# Patient Record
Sex: Male | Born: 1990 | Race: White | Hispanic: No | Marital: Single | Smoking: Current every day smoker
Health system: Southern US, Community
[De-identification: ages and names within clinical notes are randomized; demographics above are authoritative.]

---

## 2017-07-16 ENCOUNTER — Encounter (HOSPITAL_COMMUNITY): Payer: Self-pay | Admitting: Emergency Medicine

## 2017-07-16 ENCOUNTER — Other Ambulatory Visit: Payer: Self-pay

## 2017-07-16 ENCOUNTER — Emergency Department (HOSPITAL_COMMUNITY)
Admission: EM | Admit: 2017-07-16 | Discharge: 2017-07-16 | Disposition: A | Discharge status: Home / Self Care | Payer: Self-pay | Attending: Emergency Medicine | Admitting: Emergency Medicine

## 2017-07-16 DIAGNOSIS — Y905 Blood alcohol level of 100-119 mg/100 ml: Secondary | ICD-10-CM | POA: Insufficient documentation

## 2017-07-16 DIAGNOSIS — F17210 Nicotine dependence, cigarettes, uncomplicated: Secondary | ICD-10-CM | POA: Insufficient documentation

## 2017-07-16 DIAGNOSIS — T401X1A Poisoning by heroin, accidental (unintentional), initial encounter: Secondary | ICD-10-CM | POA: Insufficient documentation

## 2017-07-16 DIAGNOSIS — F10920 Alcohol use, unspecified with intoxication, uncomplicated: Secondary | ICD-10-CM | POA: Insufficient documentation

## 2017-07-16 LAB — CBC
HEMATOCRIT: 37.8 % — AB (ref 39.0–52.0)
Hemoglobin: 12.8 g/dL — ABNORMAL LOW (ref 13.0–17.0)
MCH: 31.4 pg (ref 26.0–34.0)
MCHC: 33.9 g/dL (ref 30.0–36.0)
MCV: 92.6 fL (ref 78.0–100.0)
Platelets: 186 10*3/uL (ref 150–400)
RBC: 4.08 MIL/uL — ABNORMAL LOW (ref 4.22–5.81)
RDW: 13.2 % (ref 11.5–15.5)
WBC: 9.3 10*3/uL (ref 4.0–10.5)

## 2017-07-16 LAB — COMPREHENSIVE METABOLIC PANEL
ALBUMIN: 3.8 g/dL (ref 3.5–5.0)
ALK PHOS: 56 U/L (ref 38–126)
ALT: 26 U/L (ref 17–63)
AST: 41 U/L (ref 15–41)
Anion gap: 11 (ref 5–15)
BILIRUBIN TOTAL: 0.6 mg/dL (ref 0.3–1.2)
BUN: 14 mg/dL (ref 6–20)
CO2: 24 mmol/L (ref 22–32)
CREATININE: 0.91 mg/dL (ref 0.61–1.24)
Calcium: 8.5 mg/dL — ABNORMAL LOW (ref 8.9–10.3)
Chloride: 100 mmol/L — ABNORMAL LOW (ref 101–111)
GFR calc Af Amer: >60 mL/min (ref >60)
GLUCOSE: 93 mg/dL (ref 65–99)
POTASSIUM: 3.7 mmol/L (ref 3.5–5.1)
Sodium: 135 mmol/L (ref 135–145)
TOTAL PROTEIN: 6.5 g/dL (ref 6.5–8.1)

## 2017-07-16 LAB — RAPID URINE DRUG SCREEN, HOSP PERFORMED
AMPHETAMINES: NOT DETECTED
BARBITURATES: NOT DETECTED
BENZODIAZEPINES: NOT DETECTED
Cocaine: POSITIVE — AB
Opiates: POSITIVE — AB
Tetrahydrocannabinol: NOT DETECTED

## 2017-07-16 LAB — ETHANOL: ALCOHOL ETHYL (B): 119 mg/dL — AB (ref <10)

## 2017-07-16 LAB — CBG MONITORING, ED: GLUCOSE-CAPILLARY: 88 mg/dL (ref 65–99)

## 2017-07-16 LAB — ACETAMINOPHEN LEVEL: Acetaminophen (Tylenol), Serum: <10 ug/mL — ABNORMAL LOW (ref 10–30)

## 2017-07-16 LAB — SALICYLATE LEVEL: Salicylate Lvl: <7 mg/dL (ref 2.8–30.0)

## 2017-07-16 NOTE — ED Notes (Signed)
Deb (girlfriend) phone number: [phone]

## 2017-07-16 NOTE — ED Provider Notes (Signed)
[facility]-EMERGENCY DEPT Provider Note   CSN: [id number] Arrival date & time: 07/16/17  0101     History   Chief Complaint Chief Complaint  Patient presents with  . Drug Overdose    HPI Stuart Riddle. is a 27 y.o. male.  Chief complaint is alcohol intoxication, heroin use.  HPI 27 year old male via EMS.  Had to undergo bag-valve-mask rescue after girlfriend found him unconscious in the next room.  Triage note states no Narcan was given.  However he was given intranasal Narcan by paramedics.  He arrives awake and alert.  States he did "just a little bit" of heroin.  He "shot"it.   States he does it infrequently.  History reviewed. No pertinent past medical history.  There are no active problems to display for this patient.   History reviewed. No pertinent surgical history.      Home Medications    Prior to Admission medications   Not on File    Family History History reviewed. No pertinent family history.  Social History Social History   Tobacco Use  . Smoking status: Current Every Day Smoker    Types: Cigarettes  . Smokeless tobacco: Never Used  . Tobacco comment: 10 a day  Substance Use Topics  . Alcohol use: Yes    Alcohol/week: 3.0 oz    Types: 5 Cans of beer per week    Comment: "just a few drinks on the weekends"  . Drug use: Not on file    Comment: denies     Allergies   Patient has no known allergies.   Review of Systems Review of Systems  Constitutional: Negative for appetite change, chills, diaphoresis, fatigue and fever.  HENT: Negative for mouth sores, sore throat and trouble swallowing.   Eyes: Negative for visual disturbance.  Respiratory: Negative for cough, chest tightness, shortness of breath and wheezing.   Cardiovascular: Negative for chest pain.  Gastrointestinal: Negative for abdominal distention, abdominal pain, diarrhea, nausea and vomiting.  Endocrine: Negative for polydipsia, polyphagia and polyuria.   Genitourinary: Negative for dysuria, frequency and hematuria.  Musculoskeletal: Negative for gait problem.  Skin: Negative for color change, pallor and rash.  Neurological: Negative for dizziness, syncope, light-headedness and headaches.  Hematological: Does not bruise/bleed easily.  Psychiatric/Behavioral: Negative for behavioral problems and confusion.  No current complaints.  States he feels "just tired".   Physical Exam Updated Vital Signs BP (!) 103/51   Pulse 77   Temp 97.7 F (36.5 C) (Oral)   Resp 15   Ht  (1.727 m)   Wt 72.6 kg (160 lb)   SpO2 99%   BMI 24.33 kg/m   Physical Exam  Constitutional: He is oriented to person, place, and time. He appears well-developed and well-nourished. No distress.  Awake.  Eyes closed but awakens to voice.  Lucid.  Pupils 4 mm symmetric reactive.  HENT:  Head: Normocephalic.  Eyes: Pupils are equal, round, and reactive to light. Conjunctivae are normal. No scleral icterus.  Neck: Normal range of motion. Neck supple. No thyromegaly present.  Cardiovascular: Normal rate and regular rhythm. Exam reveals no gallop and no friction rub.  No murmur heard. Pulmonary/Chest: Effort normal and breath sounds normal. No respiratory distress. He has no wheezes. He has no rales.  Abdominal: Soft. Bowel sounds are normal. He exhibits no distension. There is no tenderness. There is no rebound.  Musculoskeletal: Normal range of motion.  Neurological: He is alert and oriented to person, place, and time.  Skin: Skin is warm and dry. No rash noted.  Psychiatric: He has a normal mood and affect. His behavior is normal.     ED Treatments / Results  Labs (all labs ordered are listed, but only abnormal results are displayed) Labs Reviewed  COMPREHENSIVE METABOLIC PANEL - Abnormal; Notable for the following components:      Result Value   Chloride 100 (*)    Calcium 8.5 (*)    All other components within normal limits  ETHANOL - Abnormal;  Notable for the following components:   Alcohol, Ethyl (B) 119 (*)    All other components within normal limits  ACETAMINOPHEN LEVEL - Abnormal; Notable for the following components:   Acetaminophen (Tylenol), Serum <10 (*)    All other components within normal limits  CBC - Abnormal; Notable for the following components:   RBC 4.08 (*)    Hemoglobin 12.8 (*)    HCT 37.8 (*)    All other components within normal limits  RAPID URINE DRUG SCREEN, HOSP PERFORMED - Abnormal; Notable for the following components:   Opiates POSITIVE (*)    Cocaine POSITIVE (*)    All other components within normal limits  SALICYLATE LEVEL  CBG MONITORING, ED    EKG None  Radiology No results found.  Procedures Procedures (including critical care time)  Medications Ordered in ED Medications - No data to display   Initial Impression / Assessment and Plan / ED Course  I have reviewed the triage vital signs and the nursing notes.  Pertinent labs & imaging results that were available during my care of the patient were reviewed by me and considered in my medical decision making (see chart for details).    Here for several hours.  Remains awake and alert lucid requires no additional rescue.  Alcohol 119.  UDS positive opiate, cocaine.  At this point he has had several hours to metabolize medications.  Is ambulatory.  Tolerating p.o.  Appropriate for discharge.  Final Clinical Impressions(s) / ED Diagnoses   Final diagnoses:  Accidental overdose of heroin, initial encounter [facility])  Alcoholic intoxication without complication [facility])    ED Discharge Orders    None       Rolland Porter, MD 07/16/17 [phone]

## 2017-07-16 NOTE — ED Notes (Signed)
Pt reminded once again a urine specimen is needed.

## 2017-07-16 NOTE — ED Notes (Signed)
Pt reminded a urine specimen was needed and has urinal at bedside.

## 2017-07-16 NOTE — ED Notes (Signed)
Bed: RESB Expected date:  Expected time:  Means of arrival:  Comments: Opiate overdose

## 2017-07-16 NOTE — ED Triage Notes (Signed)
Patient BIB [facility] from home, GF called EMS after finding pt passed out in bathroom, purple color an gasping. EMS reports agonal breathing on arrival. Pt was bagged for several minutes and then became a/o x4. No narcan given. Pt denies any drug use but partial needle found at scene. Pt endorses drinking 5-6 beers.

## 2017-07-16 NOTE — Discharge Instructions (Addendum)
You can die from Heroin use, even "just a little bit".

## 2017-08-22 ENCOUNTER — Encounter (HOSPITAL_COMMUNITY): Payer: Self-pay

## 2017-08-22 ENCOUNTER — Emergency Department (HOSPITAL_COMMUNITY)
Admission: EM | Admit: 2017-08-22 | Discharge: 2017-08-22 | Disposition: A | Discharge status: Home / Self Care | Payer: Self-pay | Attending: Emergency Medicine | Admitting: Emergency Medicine

## 2017-08-22 ENCOUNTER — Other Ambulatory Visit: Payer: Self-pay

## 2017-08-22 DIAGNOSIS — J029 Acute pharyngitis, unspecified: Secondary | ICD-10-CM | POA: Insufficient documentation

## 2017-08-22 DIAGNOSIS — F17210 Nicotine dependence, cigarettes, uncomplicated: Secondary | ICD-10-CM | POA: Insufficient documentation

## 2017-08-22 DIAGNOSIS — B9789 Other viral agents as the cause of diseases classified elsewhere: Secondary | ICD-10-CM | POA: Insufficient documentation

## 2017-08-22 DIAGNOSIS — J069 Acute upper respiratory infection, unspecified: Secondary | ICD-10-CM

## 2017-08-22 LAB — GROUP A STREP BY PCR: Group A Strep by PCR: NOT DETECTED

## 2017-08-22 NOTE — ED Provider Notes (Signed)
  [facility] Provider Note   CSN: [id number] Arrival date & time: 08/22/17  1226     History   Chief Complaint Chief Complaint  Patient presents with  . Sore Throat    HPI Stuart Riddle Jr. is a 27 y.o. male.  Pt comes in with c/o congestion, cough and sore throat that has been going on for a week. Denies fever. He states that he noticed some white spots on his throat.he state that he feels like he needs antibiotics because colds don't last this long     History reviewed. No pertinent past medical history.  There are no active problems to display for this patient.   History reviewed. No pertinent surgical history.      Home Medications    Prior to Admission medications   Not on File    Family History No family history on file.  Social History Social History   Tobacco Use  . Smoking status: Current Every Day Smoker    Types: Cigarettes  . Smokeless tobacco: Never Used  . Tobacco comment: 10 a day  Substance Use Topics  . Alcohol use: Yes    Alcohol/week: 3.0 oz    Types: 5 Cans of beer per week    Comment: "just a few drinks on the weekends"  . Drug use: Never    Comment: denies     Allergies   Patient has no known allergies.   Review of Systems Review of Systems  All other systems reviewed and are negative.    Physical Exam Updated Vital Signs Ht 5\' 8"  (1.727 m)   Wt 72.6 kg (160 lb)   BMI 24.33 kg/m   Physical Exam  Constitutional: He appears well-developed and well-nourished.  HENT:  Right Ear: Tympanic membrane normal.  Left Ear: Tympanic membrane normal.  Mouth/Throat: Mucous membranes are normal. Posterior oropharyngeal edema and posterior oropharyngeal erythema present.  Neck: Normal range of motion.  Cardiovascular: Normal rate.  Pulmonary/Chest: Effort normal and breath sounds normal.  Neurological: He is alert.  Skin: Skin is warm and dry.  Nursing note and vitals  reviewed.    ED Treatments / Results  Labs (all labs ordered are listed, but only abnormal results are displayed) Labs Reviewed  GROUP A STREP BY PCR    EKG None  Radiology No results found.  Procedures Procedures (including critical care time)  Medications Ordered in ED Medications - No data to display   Initial Impression / Assessment and Plan / ED Course  I have reviewed the triage vital signs and the nursing notes.  Pertinent labs & imaging results that were available during my care of the patient were reviewed by me and considered in my medical decision making (see chart for details).     Likely viral don't think any reason to treat with antibiotics at this time  Final Clinical Impressions(s) / ED Diagnoses   Final diagnoses:  None    ED Discharge Orders    None       Teressa Lowerickering, Mihail Prettyman, NP 08/22/17 1351    Donnetta Hutchingook, Brian, MD 08/24/17 1034

## 2017-08-22 NOTE — ED Notes (Signed)
Pt verbalized understanding discharge instructions and denies any further needs or questions at this time. VS stable, ambulatory and steady gait.   

## 2017-08-22 NOTE — ED Triage Notes (Signed)
Pt states that he has been sick for a week and looked in the mirror and noticed spots in his throat.

## 2018-02-02 ENCOUNTER — Encounter (HOSPITAL_COMMUNITY): Payer: Self-pay | Admitting: Emergency Medicine

## 2018-02-02 ENCOUNTER — Other Ambulatory Visit: Payer: Self-pay

## 2018-02-02 ENCOUNTER — Emergency Department (HOSPITAL_COMMUNITY): Payer: Self-pay

## 2018-02-02 ENCOUNTER — Emergency Department (HOSPITAL_COMMUNITY)
Admission: EM | Admit: 2018-02-02 | Discharge: 2018-02-02 | Disposition: A | Discharge status: Home / Self Care | Payer: Self-pay | Attending: Emergency Medicine | Admitting: Emergency Medicine

## 2018-02-02 DIAGNOSIS — F17210 Nicotine dependence, cigarettes, uncomplicated: Secondary | ICD-10-CM | POA: Insufficient documentation

## 2018-02-02 DIAGNOSIS — R7401 Elevation of levels of liver transaminase levels: Secondary | ICD-10-CM

## 2018-02-02 DIAGNOSIS — R740 Nonspecific elevation of levels of transaminase and lactic acid dehydrogenase [LDH]: Secondary | ICD-10-CM | POA: Insufficient documentation

## 2018-02-02 DIAGNOSIS — R1011 Right upper quadrant pain: Secondary | ICD-10-CM | POA: Insufficient documentation

## 2018-02-02 LAB — URINALYSIS, ROUTINE W REFLEX MICROSCOPIC
GLUCOSE, UA: NEGATIVE mg/dL
HGB URINE DIPSTICK: NEGATIVE
KETONES UR: NEGATIVE mg/dL
Leukocytes, UA: NEGATIVE
Nitrite: NEGATIVE
PH: 5 (ref 5.0–8.0)
PROTEIN: NEGATIVE mg/dL
Specific Gravity, Urine: 1.019 (ref 1.005–1.030)

## 2018-02-02 LAB — COMPREHENSIVE METABOLIC PANEL
ALK PHOS: 193 U/L — AB (ref 38–126)
ALT: 3451 U/L — AB (ref 0–44)
AST: 2270 U/L — ABNORMAL HIGH (ref 15–41)
Albumin: 3.5 g/dL (ref 3.5–5.0)
Anion gap: 6 (ref 5–15)
BUN: 9 mg/dL (ref 6–20)
CALCIUM: 9 mg/dL (ref 8.9–10.3)
CHLORIDE: 101 mmol/L (ref 98–111)
CO2: 29 mmol/L (ref 22–32)
CREATININE: 1.01 mg/dL (ref 0.61–1.24)
Glucose, Bld: 99 mg/dL (ref 70–99)
Potassium: 4.4 mmol/L (ref 3.5–5.1)
Sodium: 136 mmol/L (ref 135–145)
Total Bilirubin: 4.9 mg/dL — ABNORMAL HIGH (ref 0.3–1.2)
Total Protein: 7 g/dL (ref 6.5–8.1)

## 2018-02-02 LAB — CBC
HEMATOCRIT: 45 % (ref 39.0–52.0)
Hemoglobin: 14.5 g/dL (ref 13.0–17.0)
MCH: 28.9 pg (ref 26.0–34.0)
MCHC: 32.2 g/dL (ref 30.0–36.0)
MCV: 89.8 fL (ref 80.0–100.0)
NRBC: 0 % (ref 0.0–0.2)
PLATELETS: 175 10*3/uL (ref 150–400)
RBC: 5.01 MIL/uL (ref 4.22–5.81)
RDW: 15.4 % (ref 11.5–15.5)
WBC: 4.8 10*3/uL (ref 4.0–10.5)

## 2018-02-02 LAB — LIPASE, BLOOD: LIPASE: 34 U/L (ref 11–51)

## 2018-02-02 MED ORDER — LACTATED RINGERS IV BOLUS
2000.0000 mL | Freq: Once | INTRAVENOUS | Status: AC
  Administered 2018-02-02: 2000 mL via INTRAVENOUS

## 2018-02-02 MED ORDER — ONDANSETRON HCL 4 MG/2ML IJ SOLN
4.0000 mg | Freq: Once | INTRAMUSCULAR | Status: AC
  Administered 2018-02-02: 4 mg via INTRAVENOUS
  Filled 2018-02-02: qty 2

## 2018-02-02 NOTE — Care Management (Signed)
[facility]. Patient verbalized understanding teach back done. No further ED CM needs identified.

## 2018-02-02 NOTE — Discharge Instructions (Signed)
Your lab work showed running.  The results do not come back for a day or so.  If they are abnormal, you will be contacted.  Please follow-up with referred clinic to have your labs redrawn in about 1 week.  Additionally, please follow-up with referred GI doctor for further evaluation and normalities.  Do not drink any alcohol or take any Tylenol.  Additionally, do not use any marijuana or any other illicit drugs.  Return emergency department for any worsening pain, fever, nausea/vomiting, chest pain, difficulty breathing or any other worsening or concerning symptoms.

## 2018-02-02 NOTE — ED Notes (Signed)
Pt provided with urinal

## 2018-02-02 NOTE — ED Notes (Signed)
Patient transported to Ultrasound 

## 2018-02-02 NOTE — ED Provider Notes (Signed)
  Care assumed from Stuart Dessert, MD at shift change with RUQ u/s pending.   In brief, this patient is a 27 y.o. male who presents for evaluation of vomiting that began 2 days ago.  Patient reports that yesterday, had 2 episodes of nonbloody, numbness vomiting.  Also reports today, and the episode as well as urine being brown which bumped him to come to the department.  Reports that he drank a shot of liquor last weekend but no other alcohol since then.  No history of hepatitis.  On exam, he has some right upper quadrant pain.  Please see note from previous provider for full history/physical.  PLAN: Patient is pending an right upper quadrant ultrasound.  We will plan to talk with GI regarding his elevation in LFTs.  MDM:  CMP shows AST of 2, 270 and ALT of 3, 451.  Alk phos is elevated at 193 and total bili is elevated at 4.9.  Ultrasound shows massive gallbladder wall thickening without gallstones.  Common bile duct is not elevated. Consult to GI placed.   Discussed patient with Dr. Claudina Lick (GI).  He recommends observing patient in the ED until hepatitis panels are back so we can determine which strain of hepatitis this is.  He does state that if this was hepatitis A, there would not be much to do but follow-up with GI.  Additionally, he states that he would need to be referred to GI for hepatitis B/C.  He does state that patient needs a follow-up in about a week with either primary care doctor to get his LFTs repeated.  He does recommend that patient abstain from any alcohol or Tylenol use.  No further recommendations. No indication for admission at this time.   I discussed with patient.  I did instruct him that the hepatitis panel takes a day or so to come back.  Patient with no pain at this time.  He does not wish to be admitted for observation but wishes rather be discharged home.  Abdominal exam is benign.  Patient is hemodynamically stable.  I discussed with patient regarding follow-up with  GI.  Additionally, social work was able to establish an appointment with primary care for repeat labs in approximately 1 week.  I instructed patient to follow-up with referred resources.  Instructed patient to refrain from any alcohol or Tylenol use. Patient had ample opportunity for questions and discussion. All patient's questions were answered with full understanding. Strict return precautions discussed. Patient expresses understanding and agreement to plan.    1. Transaminitis   2. RUQ abdominal pain       Stuart Riddle 02/02/18 4580    Stuart Dessert, MD 02/03/18 1108

## 2018-02-02 NOTE — ED Provider Notes (Addendum)
[facility] EMERGENCY DEPARTMENT Provider Note   CSN: [id number] Arrival date & time: 02/02/18  1421     History   Chief Complaint Chief Complaint  Patient presents with  . Emesis  . Diarrhea    HPI Toluwani Yadav. is a 27 y.o. male.  The history is provided by the patient.  Emesis   This is a new problem. The current episode started 2 days ago. The problem occurs 2 to 4 times per day. The problem has not changed since onset.Associated symptoms include diarrhea. Pertinent negatives include no abdominal pain, no cough, no fever and no sweats. Associated symptoms comments: No chest pain, SOB but yesterday urine was really dark.  Drinks alcohol rarely.  Does smoke cigarettes but denies any drug use.  No prior history of hepatitis.  No prior abdominal surgeries.  Symptoms started with just feeling lightheaded and nauseated for several days but then started vomiting yesterday and diarrhea today.. Risk factors: girlfriend with similar sx.  no travel, bad food or antbiotics.  Diarrhea   Associated symptoms include vomiting. Pertinent negatives include no abdominal pain, no sweats and no cough.    History reviewed. No pertinent past medical history.  There are no active problems to display for this patient.   History reviewed. No pertinent surgical history.      Home Medications    Prior to Admission medications   Not on File    Family History History reviewed. No pertinent family history.  Social History Social History   Tobacco Use  . Smoking status: Current Every Day Smoker    Types: Cigarettes  . Smokeless tobacco: Never Used  . Tobacco comment: 10 a day  Substance Use Topics  . Alcohol use: Yes    Alcohol/week: 5.0 standard drinks    Types: 5 Cans of beer per week    Comment: "just a few drinks on the weekends"  . Drug use: Never    Comment: denies     Allergies   Patient has no known allergies.   Review of Systems Review of  Systems  Constitutional: Negative for fever.  Respiratory: Negative for cough.   Gastrointestinal: Positive for diarrhea and vomiting. Negative for abdominal pain.  All other systems reviewed and are negative.    Physical Exam Updated Vital Signs BP 105/73 (BP Location: Right Arm)   Pulse 76   Temp 97.6 F (36.4 C) (Oral)   Resp 16   Ht 5\' 8"  (1.727 m)   Wt 68 kg   SpO2 97%   BMI 22.81 kg/m   Physical Exam  Constitutional: He is oriented to person, place, and time. He appears well-developed and well-nourished. No distress.  HENT:  Head: Normocephalic and atraumatic.  Mouth/Throat: Oropharynx is clear and moist.  Eyes: Pupils are equal, round, and reactive to light. Conjunctivae and EOM are normal. No scleral icterus.  Neck: Normal range of motion. Neck supple.  Cardiovascular: Normal rate, regular rhythm and intact distal pulses.  No murmur heard. Pulmonary/Chest: Effort normal and breath sounds normal. No respiratory distress. He has no wheezes. He has no rales.  Abdominal: Soft. He exhibits no distension. There is tenderness in the right upper quadrant and left upper quadrant. There is no rebound and no guarding.  Musculoskeletal: Normal range of motion. He exhibits no edema or tenderness.  Neurological: He is alert and oriented to person, place, and time.  Skin: Skin is warm and dry. No rash noted. No erythema.  Psychiatric: He has a  normal mood and affect. His behavior is normal.  Nursing note and vitals reviewed.    ED Treatments / Results  Labs (all labs ordered are listed, but only abnormal results are displayed) Labs Reviewed  COMPREHENSIVE METABOLIC PANEL - Abnormal; Notable for the following components:      Result Value   AST 2,270 (*)    Alkaline Phosphatase 193 (*)    Total Bilirubin 4.9 (*)    All other components within normal limits  URINALYSIS, ROUTINE W REFLEX MICROSCOPIC - Abnormal; Notable for the following components:   Color, Urine AMBER (*)      Bilirubin Urine MODERATE (*)    All other components within normal limits  LIPASE, BLOOD  CBC  HEPATITIS PANEL, ACUTE    EKG None  Radiology No results found.  Procedures Procedures (including critical care time)  Medications Ordered in ED Medications  lactated ringers bolus 2,000 mL (has no administration in time range)  ondansetron (ZOFRAN) injection 4 mg (4 mg Intravenous Given 02/02/18 1455)     Initial Impression / Assessment and Plan / ED Course  I have reviewed the triage vital signs and the nursing notes.  Pertinent labs & imaging results that were available during my care of the patient were reviewed by me and considered in my medical decision making (see chart for details).    Patient is a 27 year old male presenting with 4 to 5 days of not feeling well but having vomiting and diarrhea starting yesterday.  He does have upper quadrant tenderness right greater than left but no evidence of scleral icterus.  He did state that his urine was extremely dark today almost brown in color.  He denies fever, recent antibiotics, travel or bad food exposure but states his girlfriend has similar symptoms.  Concern for possible hepatitis, gallbladder disease, pancreatitis versus viral.  Patient does seem dehydrated and was given IV fluids.  CBC, CMP, lipase, UA pending  3:55 PM CBC without acute findings.  CMP with transaminitis with AST of 2,270 and ALT pending.  Lipase wnl. UA with bilirubin but no other issues.  Hepatitis panel sent and U/S of the liver to further eval.  11:04 AM Looking through patient's labs today he did come back positive for hepatitis A.  Patient was contacted and results were discussed.  He will continue to follow-up with PCP in 1 week as planned for repeat LFTs.  Final Clinical Impressions(s) / ED Diagnoses   Final diagnoses:  None    ED Discharge Orders    None       Gwyneth SproutPlunkett, Dejaun Vidrio, MD 02/03/18 1101    Gwyneth SproutPlunkett, Sylver Vantassell, MD 02/03/18  1105

## 2018-02-02 NOTE — ED Notes (Signed)
Patient verbalizes understanding of discharge instructions. Opportunity for questioning and answers were provided. Armband removed by staff, pt discharged from ED home via bus.  

## 2018-02-02 NOTE — ED Triage Notes (Addendum)
Pt arrives via POV from home with nausea/vomiting/diarrhea since last Wednesday. Pt denies recent fever/abdominal pain. States pain began after sharing a bottle of liquor with friends. States urine has been very dark colored.

## 2018-02-03 LAB — HEPATITIS PANEL, ACUTE
HEP A IGM: POSITIVE — AB
HEP B C IGM: NEGATIVE
HEP B S AG: NEGATIVE

## 2018-02-03 NOTE — ED Provider Notes (Signed)
Hepatitis A panel came back positive.  Patient was contacted and given the results of these tests.  He will plan on following up with PCP next week as planned for repeat LFTs   Gwyneth SproutPlunkett, Deangleo Passage, MD 02/03/18 1105

## 2018-02-07 ENCOUNTER — Other Ambulatory Visit: Payer: Self-pay

## 2018-02-07 ENCOUNTER — Encounter (HOSPITAL_COMMUNITY): Payer: Self-pay | Admitting: Emergency Medicine

## 2018-02-07 ENCOUNTER — Emergency Department (HOSPITAL_COMMUNITY): Payer: Self-pay

## 2018-02-07 ENCOUNTER — Emergency Department (HOSPITAL_COMMUNITY)
Admission: EM | Admit: 2018-02-07 | Discharge: 2018-02-07 | Disposition: A | Discharge status: Home / Self Care | Payer: Self-pay | Attending: Emergency Medicine | Admitting: Emergency Medicine

## 2018-02-07 DIAGNOSIS — R112 Nausea with vomiting, unspecified: Secondary | ICD-10-CM

## 2018-02-07 DIAGNOSIS — F17210 Nicotine dependence, cigarettes, uncomplicated: Secondary | ICD-10-CM | POA: Insufficient documentation

## 2018-02-07 DIAGNOSIS — B159 Hepatitis A without hepatic coma: Secondary | ICD-10-CM

## 2018-02-07 DIAGNOSIS — R17 Unspecified jaundice: Secondary | ICD-10-CM

## 2018-02-07 LAB — COMPREHENSIVE METABOLIC PANEL
ALK PHOS: 162 U/L — AB (ref 38–126)
ALT: 1379 U/L — AB (ref 0–44)
AST: 346 U/L — ABNORMAL HIGH (ref 15–41)
Albumin: 3 g/dL — ABNORMAL LOW (ref 3.5–5.0)
Anion gap: 7 (ref 5–15)
BILIRUBIN TOTAL: 8.5 mg/dL — AB (ref 0.3–1.2)
BUN: 8 mg/dL (ref 6–20)
CO2: 25 mmol/L (ref 22–32)
CREATININE: 1.18 mg/dL (ref 0.61–1.24)
Calcium: 8.7 mg/dL — ABNORMAL LOW (ref 8.9–10.3)
Chloride: 106 mmol/L (ref 98–111)
GFR calc Af Amer: >60 mL/min (ref >60)
GFR calc non Af Amer: >60 mL/min (ref >60)
GLUCOSE: 107 mg/dL — AB (ref 70–99)
Potassium: 4 mmol/L (ref 3.5–5.1)
Sodium: 138 mmol/L (ref 135–145)
TOTAL PROTEIN: 6.6 g/dL (ref 6.5–8.1)

## 2018-02-07 LAB — CBC
HCT: 41.6 % (ref 39.0–52.0)
Hemoglobin: 13.5 g/dL (ref 13.0–17.0)
MCH: 28.7 pg (ref 26.0–34.0)
MCHC: 32.5 g/dL (ref 30.0–36.0)
MCV: 88.5 fL (ref 80.0–100.0)
Platelets: 258 10*3/uL (ref 150–400)
RBC: 4.7 MIL/uL (ref 4.22–5.81)
RDW: 17.2 % — AB (ref 11.5–15.5)
WBC: 6.3 10*3/uL (ref 4.0–10.5)
nRBC: 0 % (ref 0.0–0.2)

## 2018-02-07 LAB — LIPASE, BLOOD: Lipase: 40 U/L (ref 11–51)

## 2018-02-07 MED ORDER — ONDANSETRON 4 MG PO TBDP: 4.0000 mg | ORAL_TABLET | Freq: Three times a day (TID) | ORAL | 0 refills | Status: AC | PRN

## 2018-02-07 MED ORDER — ONDANSETRON HCL 4 MG/2ML IJ SOLN
4.0000 mg | Freq: Once | INTRAMUSCULAR | Status: AC
  Administered 2018-02-07: 4 mg via INTRAVENOUS
  Filled 2018-02-07: qty 2

## 2018-02-07 MED ORDER — SODIUM CHLORIDE 0.9 % IV BOLUS
1000.0000 mL | Freq: Once | INTRAVENOUS | Status: AC
  Administered 2018-02-07: 1000 mL via INTRAVENOUS

## 2018-02-07 NOTE — Discharge Instructions (Addendum)
You were evaluated in the Emergency Department and after careful evaluation, we did not find any emergent condition requiring admission or further testing in the hospital.  Your symptoms today seem to be due to hepatitis A infection.  Please use the anti-nausea medicine provided to hydrate yourself allow yourself to eat.  It is important that you follow-up with a primary care provider as well as a GI doctor.  Please return to the Emergency Department if you experience any worsening of your condition.  We encourage you to follow up with a primary care provider.  Thank you for allowing us to be a part of your care.

## 2018-02-07 NOTE — ED Provider Notes (Signed)
[facility] Provider Note MRN:  [id number]  Arrival date & time: 02/07/18     Chief Complaint   Emesis and Abdominal Pain   History of Present Illness   Stuart PushJeffery Dubiel Jr. is a 27 y.o. year-old male with a history of recently diagnosed hepatitis A presenting to the ED with chief complaint of emesis.  Patient explains that he was diagnosed with hepatitis A 4 days ago, was seen in the emergency department, GI was consulted, plan was made to follow him as an outpatient.  Patient has been unable to follow-up with the PCP or GI doctor.  Patient explains that he had right upper quadrant pain 4 days ago, but this is improving.  He explains that he is getting more more itchy throughout his entire body, feels it is getting more jaundiced or yellow.  Also endorsing continued nausea and vomiting, unable to eat or drink anything.  Continued diarrhea as well.  Denies fevers, no chest pain or shortness of breath.  Review of Systems  A complete 10 system review of systems was obtained and all systems are negative except as noted in the HPI and PMH.   Patient's Health History   History reviewed. No pertinent past medical history.  History reviewed. No pertinent surgical history.  No family history on file.  Social History   Socioeconomic History  . Marital status: Single    Spouse name: Not on file  . Number of children: Not on file  . Years of education: Not on file  . Highest education level: Not on file  Occupational History  . Not on file  Social Needs  . Financial resource strain: Not on file  . Food insecurity:    Worry: Not on file    Inability: Not on file  . Transportation needs:    Medical: Not on file    Non-medical: Not on file  Tobacco Use  . Smoking status: Current Every Day Smoker    Types: Cigarettes  . Smokeless tobacco: Never Used  . Tobacco comment: 10 a day  Substance and Sexual Activity  . Alcohol use: Yes    Alcohol/week: 5.0  standard drinks    Types: 5 Cans of beer per week    Comment: "just a few drinks on the weekends"  . Drug use: Never    Comment: denies  . Sexual activity: Yes    Birth control/protection: None  Lifestyle  . Physical activity:    Days per week: Not on file    Minutes per session: Not on file  . Stress: Not on file  Relationships  . Social connections:    Talks on phone: Not on file    Gets together: Not on file    Attends religious service: Not on file    Active member of club or organization: Not on file    Attends meetings of clubs or organizations: Not on file    Relationship status: Not on file  . Intimate partner violence:    Fear of current or ex partner: Not on file    Emotionally abused: Not on file    Physically abused: Not on file    Forced sexual activity: Not on file  Other Topics Concern  . Not on file  Social History Narrative  . Not on file     Physical Exam  Vital Signs and Nursing Notes reviewed Vitals:   02/07/18 1900 02/07/18 2000  BP: 101/62 (!) 98/55  Pulse: 64 (!) 49  Resp:    Temp:    SpO2: 100% 100%    CONSTITUTIONAL: Well-appearing, NAD, mildly jaundiced NEURO:  Alert and oriented x 3, no focal deficits EYES:  eyes equal and reactive ENT/NECK:  no LAD, no JVD CARDIO: Regular rate, well-perfused, normal S1 and S2 PULM:  CTAB no wheezing or rhonchi GI/GU:  normal bowel sounds, non-distended, non-tender MSK/SPINE:  No gross deformities, no edema SKIN:  no rash, atraumatic PSYCH:  Appropriate speech and behavior  Diagnostic and Interventional Summary    Labs Reviewed  COMPREHENSIVE METABOLIC PANEL - Abnormal; Notable for the following components:      Result Value   Glucose, Bld 107 (*)    Calcium 8.7 (*)    Albumin 3.0 (*)    AST 346 (*)    ALT 1,379 (*)    Alkaline Phosphatase 162 (*)    Total Bilirubin 8.5 (*)    All other components within normal limits  CBC - Abnormal; Notable for the following components:   RDW 17.2 (*)     All other components within normal limits  LIPASE, BLOOD  URINALYSIS, ROUTINE W REFLEX MICROSCOPIC    US Abdomen Limited RUQ  Final Result      Medications  sodium chloride 0.9 % bolus 1,000 mL (1,000 mLs Intravenous New Bag/Given 02/07/18 2004)  ondansetron (ZOFRAN) injection 4 mg (4 mg Intravenous Given 02/07/18 1958)     Procedures Critical Care  ED Course and Medical Decision Making  I have reviewed the triage vital signs and the nursing notes.  Pertinent labs & imaging results that were available during my care of the patient were reviewed by me and considered in my medical decision making (see below for details).  Continued symptoms that seem to be related to recent diagnosis of hepatitis A in this 27 year old male.  Per chart review, AST and ALT are improving, but bilirubin has doubled and now is 8.5 today.  A right upper quadrant ultrasound was performed 4 days ago, revealing a markedly enlarged gallbladder.  Patient is largely nontender on exam, but will repeat this ultrasound to ensure that this hyperbilirubinemia is not related to this pathology.  We will also discuss these laboratory values with GI to determine continued outpatient versus inpatient management.  Ultrasound reveals no acute changes.  Discussed with GI on-call, this improvement in AST/ALT and later rising bilirubin is to be expected.  Nothing to be done urgently, should follow-up with GI.  Patient feeling well and requesting discharge, prescription for Zofran, will follow-up with PCP and GI soon as possible.  After the discussed management above, the patient was determined to be safe for discharge.  The patient was in agreement with this plan and all questions regarding their care were answered.  ED return precautions were discussed and the patient will return to the ED with any significant worsening of condition.  Elmer Sow. Pilar Plate, MD [facility] Emergency Medicine [facility]  Health [email or web address]  Final Clinical Impressions(s) / ED Diagnoses     ICD-10-CM   1. Viral hepatitis A without hepatic coma B15.9   2. Jaundice R17 US Abdomen Limited RUQ    US Abdomen Limited RUQ  3. Nausea and vomiting, intractability of vomiting not specified, unspecified vomiting type R11.2     ED Discharge Orders         Ordered    ondansetron (ZOFRAN ODT) 4 MG disintegrating tablet  Every 8 hours PRN     02/07/18 2149  Sabas Sous, MD 02/07/18 2201

## 2018-02-07 NOTE — ED Triage Notes (Signed)
Pt arrives with worsening nausea and vomiting, diarrhea since last week.  Reports seen last week for same and getting worse.   Dxed with hepatitis A during last visit.

## 2018-06-01 ENCOUNTER — Emergency Department (HOSPITAL_COMMUNITY)
Admission: EM | Admit: 2018-06-01 | Discharge: 2018-06-01 | Disposition: A | Discharge status: Home / Self Care | Payer: Self-pay | Attending: Emergency Medicine | Admitting: Emergency Medicine

## 2018-06-01 ENCOUNTER — Other Ambulatory Visit: Payer: Self-pay

## 2018-06-01 ENCOUNTER — Encounter (HOSPITAL_COMMUNITY): Payer: Self-pay

## 2018-06-01 DIAGNOSIS — K0889 Other specified disorders of teeth and supporting structures: Secondary | ICD-10-CM | POA: Insufficient documentation

## 2018-06-01 DIAGNOSIS — Z5321 Procedure and treatment not carried out due to patient leaving prior to being seen by health care provider: Secondary | ICD-10-CM | POA: Insufficient documentation

## 2018-06-01 NOTE — ED Triage Notes (Signed)
Pt here for reoccurring dental pan.  States hes had pain for last 2 days.  Hx of the same pain previously and prescribed amoxicillin for it and pain resolved.  No airway problems. A&Ox4.

## 2019-06-23 ENCOUNTER — Ambulatory Visit: Payer: Self-pay

## 2020-09-30 IMAGING — US US ABDOMEN LIMITED
1 series · 14 of 25 positions shown · non-contrast
Comparison: 02/02/2018 right upper quadrant ultrasound.

CLINICAL DATA: 27 y/o  M; worsening right upper quadrant pain.

EXAM:
ULTRASOUND ABDOMEN LIMITED RIGHT UPPER QUADRANT

[Series 1: us abdomen limited · 0.16mm/px · 14 of 31 slices shown]
[im 1/31]
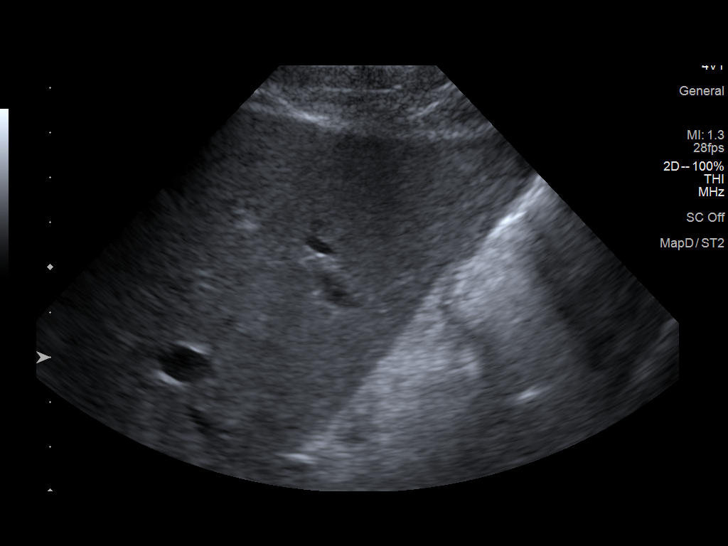
[im 3/31]
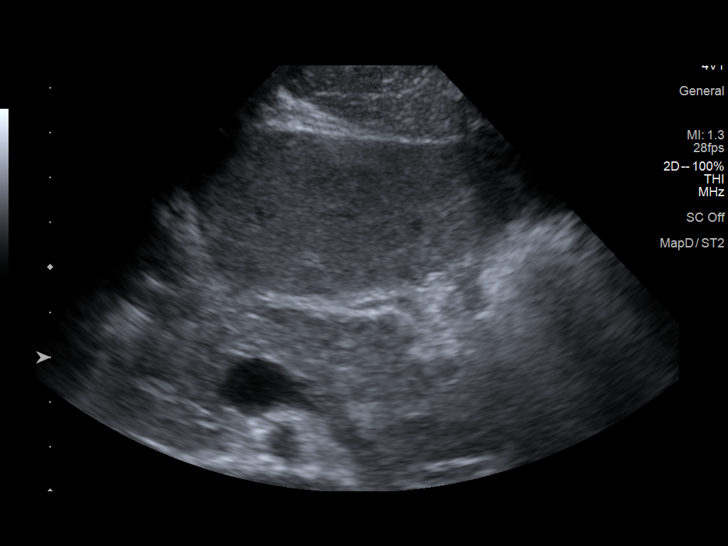
[im 6/31]
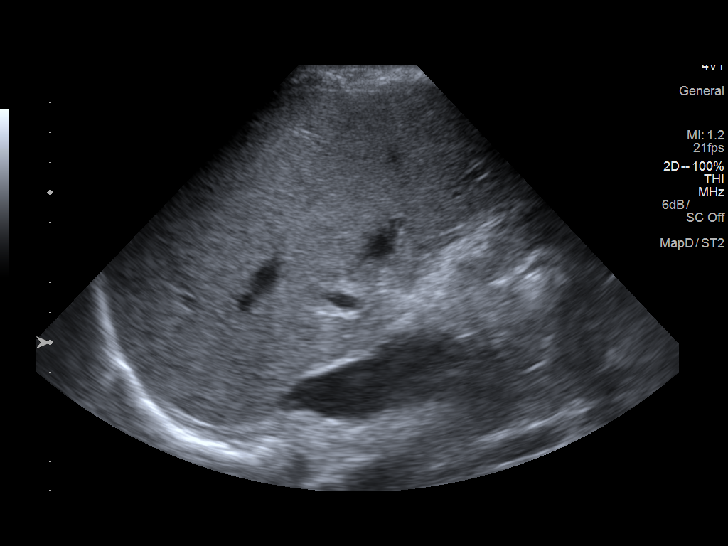
[im 8/31]
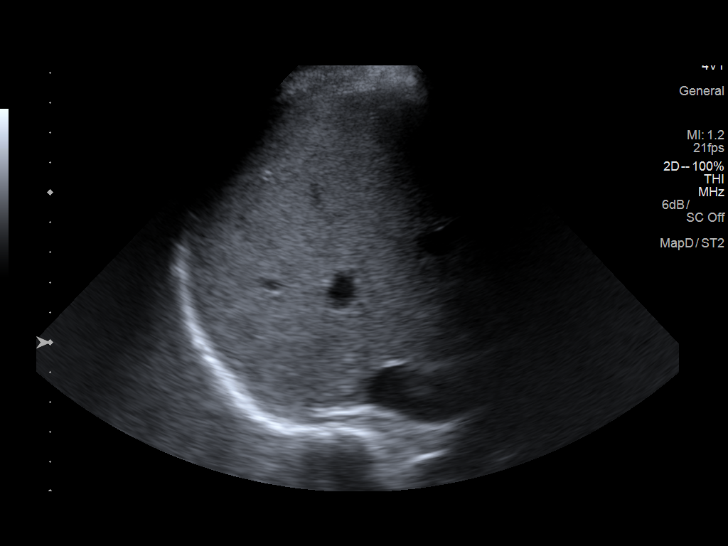
[im 11/31]
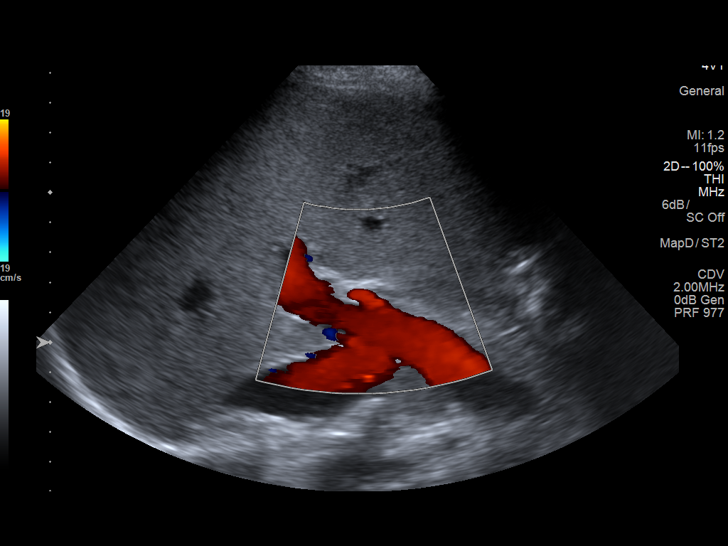
[im 12/31]
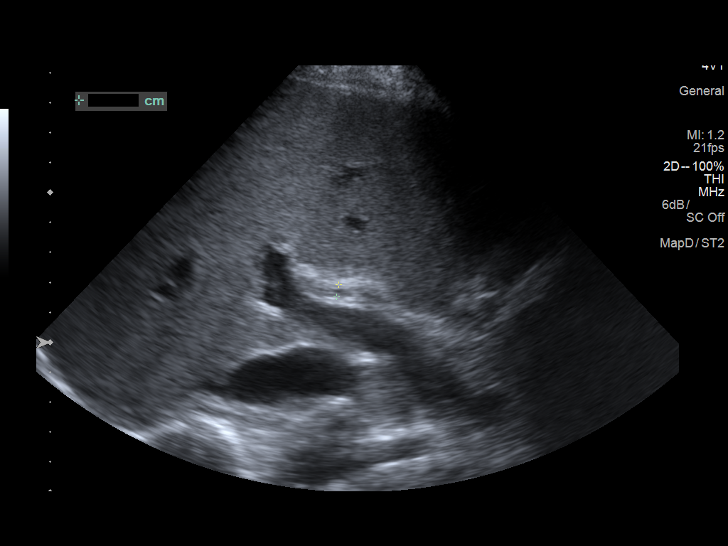
[im 14/31]
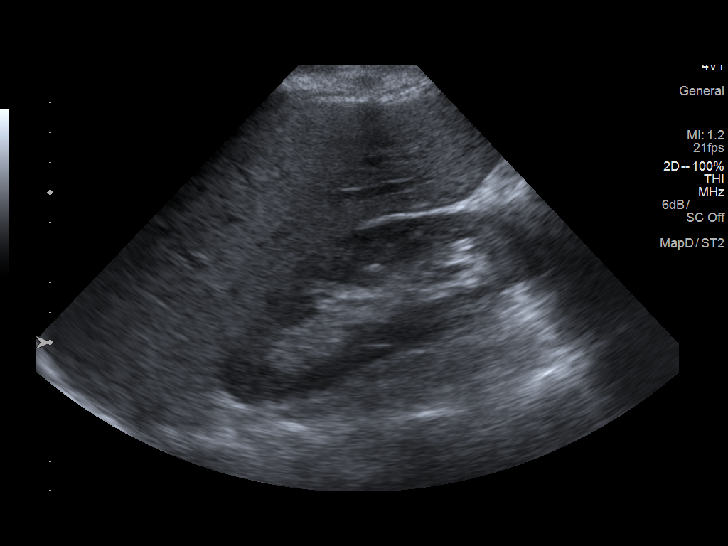
[im 17/31]
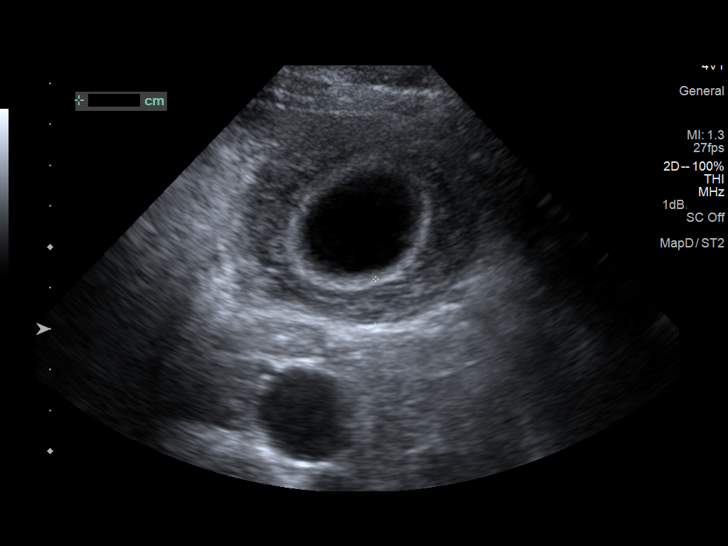
[im 19/31]
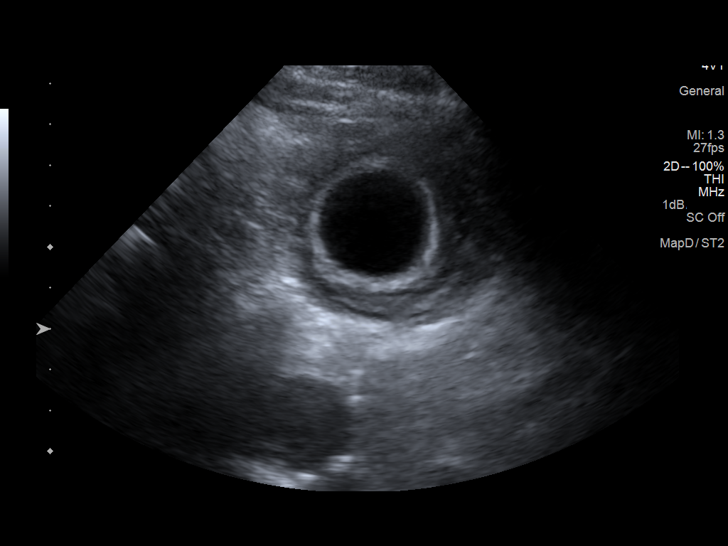
[im 21/31]
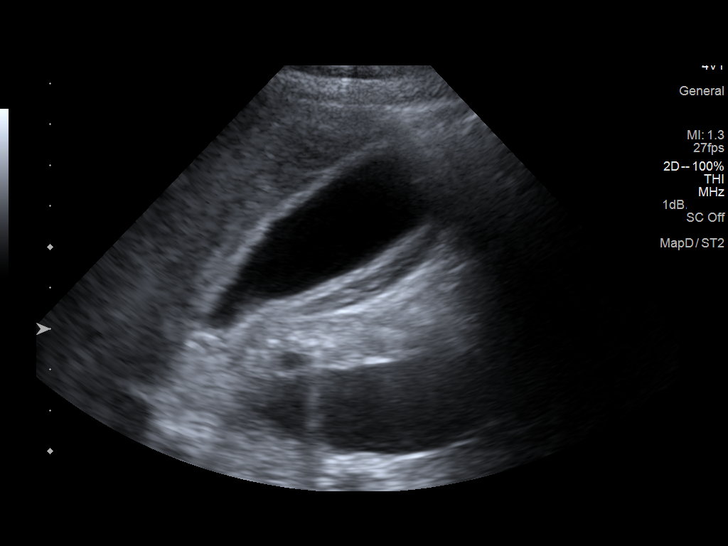
[im 23/31]
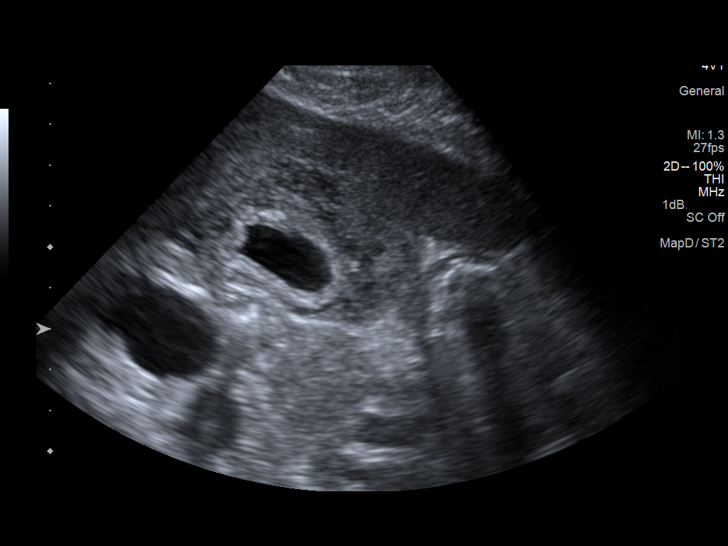
[im 26/31]
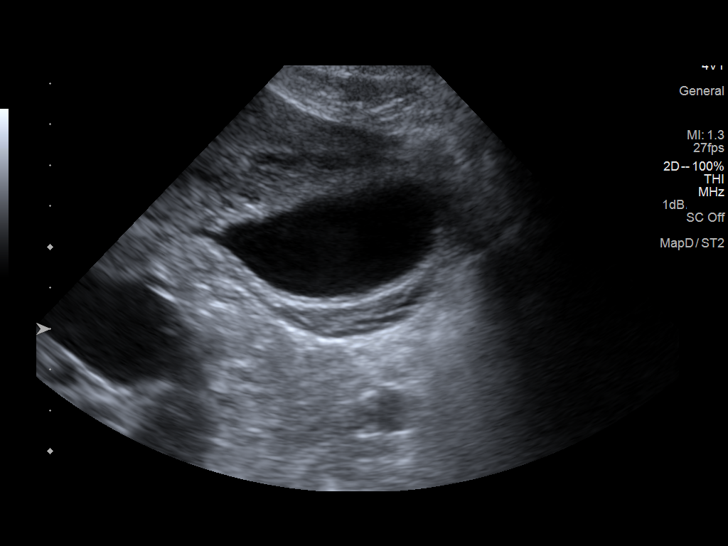
[im 28/31]
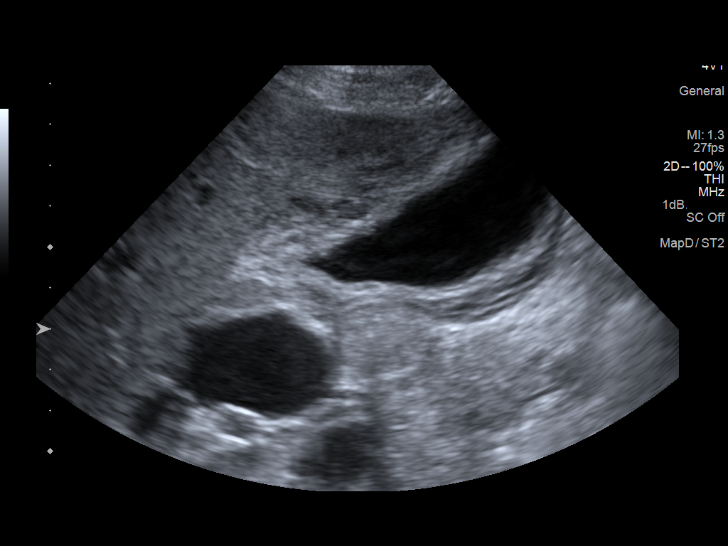
[im 31/31]
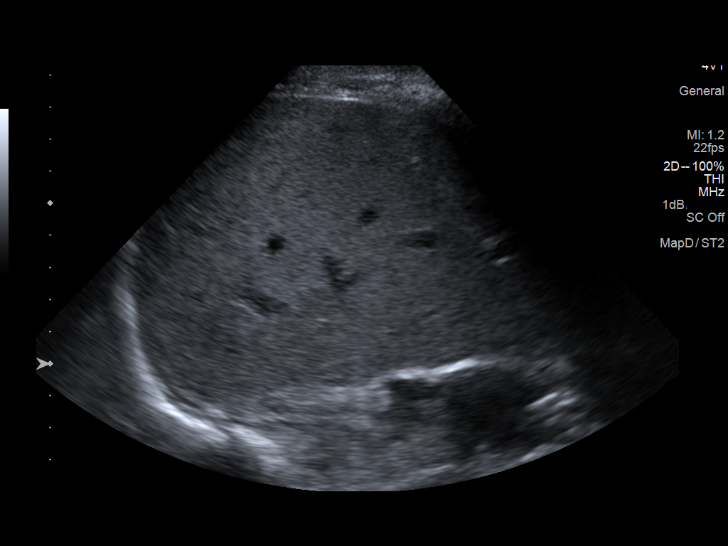

[14 of 25 positions shown; findings below may reference images not displayed]

FINDINGS: Gallbladder:

Diffuse gallbladder wall thickening to 11 mm. No cholelithiasis or
pericholecystic fluid.

Common bile duct:

Diameter: 4.1 mm

Liver:

No focal lesion identified. Within normal limits in parenchymal
echogenicity. Portal vein is patent on color Doppler imaging with
normal direction of blood flow towards the liver.
IMPRESSION: Stable gallbladder wall thickening. No gallstone. No biliary ductal
dilatation. Findings are nonspecific and may represent acalculous
cholecystitis, reactive changes due to local inflammation, heart
failure, HIV, or hypoproteinemia.
